# Patient Record
Sex: Male | Born: 2000 | Race: White | Hispanic: No | Marital: Single | Smoking: Never smoker
Health system: Southern US, Community
[De-identification: ages and names within clinical notes are randomized; demographics above are authoritative.]

## PROBLEM LIST (undated history)

## (undated) DIAGNOSIS — T7840XA Allergy, unspecified, initial encounter: Secondary | ICD-10-CM

## (undated) DIAGNOSIS — S060X9A Concussion with loss of consciousness of unspecified duration, initial encounter: Secondary | ICD-10-CM

---
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
## 2022-01-01 DIAGNOSIS — Z13220 Encounter for screening for lipoid disorders: Secondary | ICD-10-CM | POA: Diagnosis not present

## 2022-01-01 DIAGNOSIS — Z130 Encounter for screening for diseases of the blood and blood-forming organs and certain disorders involving the immune mechanism: Secondary | ICD-10-CM | POA: Diagnosis not present

## 2022-01-01 DIAGNOSIS — Z68.41 Body mass index (BMI) pediatric, greater than or equal to 95th percentile for age: Secondary | ICD-10-CM | POA: Diagnosis not present

## 2022-01-01 DIAGNOSIS — Z0000 Encounter for general adult medical examination without abnormal findings: Secondary | ICD-10-CM | POA: Diagnosis not present

## 2022-01-01 DIAGNOSIS — Z113 Encounter for screening for infections with a predominantly sexual mode of transmission: Secondary | ICD-10-CM | POA: Diagnosis not present

## 2022-01-25 DIAGNOSIS — Z113 Encounter for screening for infections with a predominantly sexual mode of transmission: Secondary | ICD-10-CM | POA: Diagnosis not present

## 2022-01-25 DIAGNOSIS — J039 Acute tonsillitis, unspecified: Secondary | ICD-10-CM | POA: Diagnosis not present

## 2022-01-26 DIAGNOSIS — Z419 Encounter for procedure for purposes other than remedying health state, unspecified: Secondary | ICD-10-CM | POA: Diagnosis not present

## 2022-02-25 DIAGNOSIS — Z419 Encounter for procedure for purposes other than remedying health state, unspecified: Secondary | ICD-10-CM | POA: Diagnosis not present
